# Patient Record
Sex: Female | Born: 1980 | Race: White | Hispanic: No | Marital: Single | Smoking: Current every day smoker
Health system: Southern US, Community
[De-identification: ages and names within clinical notes are randomized; demographics above are authoritative.]

---

## 2004-07-15 ENCOUNTER — Emergency Department: Payer: Self-pay | Admitting: Emergency Medicine

## 2004-07-21 ENCOUNTER — Emergency Department: Payer: Self-pay | Admitting: Emergency Medicine

## 2004-10-09 ENCOUNTER — Emergency Department: Payer: Self-pay | Admitting: Emergency Medicine

## 2004-10-10 ENCOUNTER — Other Ambulatory Visit: Payer: Self-pay

## 2005-04-08 ENCOUNTER — Emergency Department: Payer: Self-pay | Admitting: Emergency Medicine

## 2005-07-30 ENCOUNTER — Emergency Department: Payer: Self-pay | Admitting: Emergency Medicine

## 2006-02-06 ENCOUNTER — Emergency Department: Payer: Self-pay | Admitting: Emergency Medicine

## 2007-08-21 ENCOUNTER — Emergency Department: Payer: Self-pay | Admitting: Emergency Medicine

## 2008-03-11 ENCOUNTER — Emergency Department: Payer: Self-pay | Admitting: Emergency Medicine

## 2008-07-29 ENCOUNTER — Emergency Department: Payer: Self-pay | Admitting: Emergency Medicine

## 2013-07-06 ENCOUNTER — Emergency Department: Payer: Self-pay | Admitting: Emergency Medicine

## 2013-12-27 ENCOUNTER — Emergency Department: Payer: Self-pay | Admitting: Student

## 2014-01-09 ENCOUNTER — Emergency Department: Payer: Self-pay | Admitting: Emergency Medicine

## 2014-01-10 ENCOUNTER — Emergency Department: Payer: Self-pay | Admitting: Emergency Medicine

## 2014-01-14 ENCOUNTER — Emergency Department: Payer: Self-pay | Admitting: Emergency Medicine
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
## 2016-01-04 ENCOUNTER — Emergency Department
Admission: EM | Admit: 2016-01-04 | Discharge: 2016-01-04 | Disposition: A | Discharge status: Home / Self Care | Payer: Self-pay | Attending: Emergency Medicine | Admitting: Emergency Medicine

## 2016-01-04 ENCOUNTER — Encounter: Payer: Self-pay | Admitting: Emergency Medicine

## 2016-01-04 DIAGNOSIS — F17200 Nicotine dependence, unspecified, uncomplicated: Secondary | ICD-10-CM | POA: Insufficient documentation

## 2016-01-04 DIAGNOSIS — L03317 Cellulitis of buttock: Secondary | ICD-10-CM | POA: Insufficient documentation

## 2016-01-04 MED ORDER — SULFAMETHOXAZOLE-TRIMETHOPRIM 800-160 MG PO TABS: 1.0000 | ORAL_TABLET | Freq: Two times a day (BID) | ORAL | 0 refills | Status: AC

## 2016-01-04 MED ORDER — HYDROCODONE-ACETAMINOPHEN 5-325 MG PO TABS: 1.0000 | ORAL_TABLET | ORAL | 0 refills | Status: AC | PRN

## 2016-01-04 NOTE — ED Provider Notes (Signed)
[facility] Emergency Department Provider Note  ____________________________________________  Time seen: Approximately 3:30 PM  I have reviewed the triage vital signs and the nursing notes.   HISTORY  Chief Complaint Abscess    HPI Sally Young is a 35 y.o. female who presents to emergency department complaining of a "boil" to her right buttocks. Patient states that symptoms first began 4 days ago and if increased. Patient reports swelling and redness to the area. She denies any drainage from same. Patient states that it is very painful to sit. No complaints. No fevers chills. No nausea or vomiting. No abdominal pain. Patient has tried ibuprofen for pain but states that there is minimal pain relief at this time.   History reviewed. No pertinent past medical history.  There are no active problems to display for this patient.   History reviewed. No pertinent surgical history.  Prior to Admission medications   Medication Sig Start Date End Date Taking? Authorizing Provider  HYDROcodone-acetaminophen (NORCO/VICODIN) 5-325 MG tablet Take 1 tablet by mouth every 4 (four) hours as needed for moderate pain. 01/04/16   Delorise Royals Leyda Vanderwerf, PA-C  sulfamethoxazole-trimethoprim (BACTRIM DS,SEPTRA DS) 800-160 MG tablet Take 1 tablet by mouth 2 (two) times daily. 01/04/16   Delorise Royals Myesha Stillion, PA-C    Allergies Review of patient's allergies indicates no known allergies.  No family history on file.  Social History Social History  Substance Use Topics  . Smoking status: Current Every Day Smoker  . Smokeless tobacco: Never Used  . Alcohol use Yes     Review of Systems  Constitutional: No fever/chills Cardiovascular: no chest pain. Respiratory: no cough. No SOB. Gastrointestinal: No abdominal pain.  No nausea, no vomiting.  No diarrhea.  No constipation. Musculoskeletal: Negative for musculoskeletal pain. Skin: Negative for rash, abrasions, lacerations,  ecchymosis.Positive for "boil" to right buttocks. Neurological: Negative for headaches, focal weakness or numbness. 10-point ROS otherwise negative.  ____________________________________________   PHYSICAL EXAM:  VITAL SIGNS: ED Triage Vitals [01/04/16 1524]  Enc Vitals Group     BP (!) 142/70     Pulse Rate (!) 114     Resp 20     Temp 98.5 F (36.9 C)     Temp Source Oral     SpO2 98 %     Weight 180 lb (81.6 kg)     Height 5\' 6"  (1.676 m)     Head Circumference      Peak Flow      Pain Score 3     Pain Loc      Pain Edu?      Excl. in GC?      Constitutional: Alert and oriented. Well appearing and in no acute distress. Eyes: Conjunctivae are normal. PERRL. EOMI. Head: Atraumatic. Neck: No stridor.    Cardiovascular: Normal rate, regular rhythm. Normal S1 and S2.  Good peripheral circulation. Respiratory: Normal respiratory effort without tachypnea or retractions. Lungs CTAB. Good air entry to the bases with no decreased or absent breath sounds. Gastrointestinal: Bowel sounds 4 quadrants. Soft and nontender to palpation. No guarding or rigidity. No palpable masses. No distention.  Musculoskeletal: Full range of motion to all extremities. No gross deformities appreciated. Neurologic:  Normal speech and language. No gross focal neurologic deficits are appreciated.  Skin:  Skin is warm, dry and intact. No rash noted. Erythematous and edematous lesion noted to the right lateral buttocks. Area is very tender to palpation. Area is firm to palpation. No fluctuance. No drainage.  Area measures approximately 5 cm in diameter. Psychiatric: Mood and affect are normal. Speech and behavior are normal. Patient exhibits appropriate insight and judgement.   ____________________________________________   LABS (all labs ordered are listed, but only abnormal results are displayed)  Labs Reviewed - No data to  display ____________________________________________  EKG   ____________________________________________  RADIOLOGY   No results found.  ____________________________________________    PROCEDURES  Procedure(s) performed:    Procedures    Medications - No data to display   ____________________________________________   INITIAL IMPRESSION / ASSESSMENT AND PLAN / ED COURSE  Pertinent labs & imaging results that were available during my care of the patient were reviewed by me and considered in my medical decision making (see chart for details).  Review of the [place] CSRS was performed in accordance of the [facility] prior to dispensing any controlled drugs.  Clinical Course    Patient's diagnosis is consistent with Cellulitis of the right buttocks. No indication for abscess or incision and drainage at this time. Patient will be discharged home with prescriptions for antibiotics and limited pain medication. Patient is to follow up with primary care as needed or otherwise directed. Patient is given ED precautions to return to the ED for any worsening or new symptoms.     ____________________________________________  FINAL CLINICAL IMPRESSION(S) / ED DIAGNOSES  Final diagnoses:  Cellulitis of right buttock      NEW MEDICATIONS STARTED DURING THIS VISIT:  New Prescriptions   HYDROCODONE-ACETAMINOPHEN (NORCO/VICODIN) 5-325 MG TABLET    Take 1 tablet by mouth every 4 (four) hours as needed for moderate pain.   SULFAMETHOXAZOLE-TRIMETHOPRIM (BACTRIM DS,SEPTRA DS) 800-160 MG TABLET    Take 1 tablet by mouth 2 (two) times daily.        This chart was dictated using voice recognition software/Dragon. Despite best efforts to proofread, errors can occur which can change the meaning. Any change was purely unintentional.    Racheal PatchesJonathan D Kinzy Weyers, PA-C 01/04/16 1554    Phineas SemenGraydon Goodman, MD 01/04/16 2018

## 2016-01-04 NOTE — ED Triage Notes (Signed)
States she has a possible abscess area to buttocks developed this about 1 week ago
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
